# Patient Record
Sex: Male | Born: 1975 | Race: Black or African American | Hispanic: No | Marital: Married | Smoking: Never smoker
Health system: Southern US, Community
[De-identification: ages and names within clinical notes are randomized; demographics above are authoritative.]

## PROBLEM LIST (undated history)

## (undated) HISTORY — DX: Morbid (severe) obesity due to excess calories: E66.01

## (undated) HISTORY — PX: KNEE SURGERY: SHX244

---

## 1998-12-28 ENCOUNTER — Encounter: Payer: Self-pay | Admitting: Emergency Medicine

## 1998-12-28 ENCOUNTER — Emergency Department (HOSPITAL_COMMUNITY): Admission: EM | Admit: 1998-12-28 | Discharge: 1998-12-28 | Payer: Self-pay | Admitting: Emergency Medicine

## 2005-06-26 ENCOUNTER — Encounter: Admission: RE | Admit: 2005-06-26 | Discharge: 2005-06-26 | Payer: Self-pay | Admitting: Internal Medicine

## 2005-10-24 ENCOUNTER — Emergency Department (HOSPITAL_COMMUNITY): Admission: EM | Admit: 2005-10-24 | Discharge: 2005-10-24 | Payer: Self-pay | Admitting: Emergency Medicine

## 2005-11-02 ENCOUNTER — Inpatient Hospital Stay (HOSPITAL_COMMUNITY): Admission: RE | Admit: 2005-11-02 | Discharge: 2005-11-05 | Payer: Self-pay | Admitting: Orthopedic Surgery

## 2007-07-10 ENCOUNTER — Emergency Department (HOSPITAL_COMMUNITY): Admission: EM | Admit: 2007-07-10 | Discharge: 2007-07-10 | Payer: Self-pay | Admitting: Emergency Medicine

## 2008-09-29 ENCOUNTER — Emergency Department (HOSPITAL_COMMUNITY): Admission: EM | Admit: 2008-09-29 | Discharge: 2008-09-29 | Payer: Self-pay | Admitting: Emergency Medicine

## 2010-06-04 ENCOUNTER — Emergency Department (HOSPITAL_BASED_OUTPATIENT_CLINIC_OR_DEPARTMENT_OTHER)
Admission: EM | Admit: 2010-06-04 | Discharge: 2010-06-04 | Payer: Self-pay | Source: Home / Self Care | Admitting: Emergency Medicine

## 2010-07-19 ENCOUNTER — Emergency Department (INDEPENDENT_AMBULATORY_CARE_PROVIDER_SITE_OTHER): Payer: Commercial Managed Care - PPO

## 2010-07-19 ENCOUNTER — Emergency Department (HOSPITAL_BASED_OUTPATIENT_CLINIC_OR_DEPARTMENT_OTHER)
Admission: EM | Admit: 2010-07-19 | Discharge: 2010-07-19 | Disposition: A | Discharge status: Home / Self Care | Payer: Commercial Managed Care - PPO | Attending: Emergency Medicine | Admitting: Emergency Medicine

## 2010-07-19 DIAGNOSIS — R079 Chest pain, unspecified: Secondary | ICD-10-CM | POA: Insufficient documentation

## 2010-07-19 DIAGNOSIS — M79609 Pain in unspecified limb: Secondary | ICD-10-CM | POA: Insufficient documentation

## 2010-07-19 DIAGNOSIS — R071 Chest pain on breathing: Secondary | ICD-10-CM | POA: Insufficient documentation

## 2010-07-19 DIAGNOSIS — I10 Essential (primary) hypertension: Secondary | ICD-10-CM | POA: Insufficient documentation

## 2010-07-19 DIAGNOSIS — J45909 Unspecified asthma, uncomplicated: Secondary | ICD-10-CM | POA: Insufficient documentation

## 2010-08-22 LAB — POCT I-STAT, CHEM 8
BUN: 21 mg/dL (ref 6–23)
Hemoglobin: 15.3 g/dL (ref 13.0–17.0)
Potassium: 3.9 mEq/L (ref 3.5–5.1)
Sodium: 139 mEq/L (ref 135–145)
TCO2: 24 mmol/L (ref 0–100)

## 2010-08-22 LAB — DIFFERENTIAL
Basophils Relative: 1 % (ref 0–1)
Eosinophils Relative: 5 % (ref 0–5)
Monocytes Absolute: 0.5 10*3/uL (ref 0.1–1.0)
Neutro Abs: 3.8 10*3/uL (ref 1.7–7.7)
Neutrophils Relative %: 59 % (ref 43–77)

## 2010-08-22 LAB — CBC
HCT: 42.4 % (ref 39.0–52.0)
Hemoglobin: 14.2 g/dL (ref 13.0–17.0)
MCHC: 33.4 g/dL (ref 30.0–36.0)
MCV: 88 fL (ref 78.0–100.0)
RBC: 4.82 MIL/uL (ref 4.22–5.81)
RDW: 12.9 % (ref 11.5–15.5)

## 2010-08-22 LAB — POCT CARDIAC MARKERS: Troponin i, poc: <0.05 ng/mL (ref 0.00–0.09)

## 2010-09-29 NOTE — Discharge Summary (Signed)
NAMESUMNER, KIRCHMAN                 ACCOUNT NO.:  [id number]   MEDICAL RECORD NO.:  [id number]          PATIENT TYPE:  INP   LOCATION:  1610                         FACILITY:  [facility]   PHYSICIAN:  Georges Lynch. Gioffre, M.D.DATE OF BIRTH:  10/13/1975   DATE OF ADMISSION:  11/02/2005  DATE OF DISCHARGE:  11/05/2005                                 DISCHARGE SUMMARY   ADMISSION DIAGNOSES:  1.  Right knee quad tendon rupture.  2.  Hypertension.  3.  Asthma.   DISCHARGE DIAGNOSES:  1.  Surgical repair of right quad tendon rupture with sutures and graft      material.  2.  Hypertension.  3.  Asthma.   HISTORY OF PRESENT ILLNESS:  The patient is a 35 year old gentleman who was  playing basketball when he jumped up to try to dunk the ball when he felt  like he had someone kick him in the knee. He fell to the ground buckling his  leg up underneath him. He was unable to fully extend and ambulate on that  leg due to discomfort. He was taken to the ER where x-rays were taken  without any signs of dislocations or fractures. He was placed in a long-leg  splint, returned to our office. Further evaluation was suggestive of a quad  tendon ruptured. An MRI was performed and this confirmed that he did in fact  have a quad tendon rupture on the right knee and he was set up for surgical  repair.   PROCEDURE:  On June 22, the patient was taken to the OR by Dr. Worthy Rancher,  assisted by Arlyn Leak, PA-C. Under general anesthesia, he underwent a quad  tendon repair of his right knee with two Restore patches. The patient  tolerated the procedure well and was transferred to the recovery room and  then to the orthopedic floor in good condition.   CONSULTATIONS:  Physical therapy consult.   HOSPITAL COURSE:  On June 22, the patient was admitted to [facility] under the care of Dr. Worthy Rancher. The patient was taken to the OR  where a right knee quad tendon repair was performed requiring two  Restore  patches. The patient tolerated the procedure well and was transferred to the  recovery room in good condition and then to the orthopedic floor. The  patient was on IV antibiotics, pain medicines and started on Coumadin  protocol for DVT prophylaxis. The patient then incurred a 3-day  postoperative course on the orthopedic floor while his Coumadin was becoming  therapeutic. He worked well with physical therapy, his pain was well-  controlled. He was able to transfer from IV medications to p.o. medications  well without any complications. His wound remained benign for any signs of  infection. His leg remained neuromotor vascularly intact. On postoperative  day #3, it was felt the patient was ready for discharge home. He was  discharged on Coumadin and p.o. pain medications with a recommendation to  followup in one week from date of discharge. The patient was discharged in  good condition.   LABORATORY  DATA:  CBC on June 23, WBC is 9.3, hemoglobin 12.2, hematocrit  36.2, platelets 115. INR on the 25th of June was 1.2, routine chemistries on  admission were normal. EKG on admission was normal sinus rhythm at 83 beats  per minute. Chest x-ray on admission showed no cardiopulmonary disease.   MEDICATIONS ON DISCHARGE FROM ORTHOPEDIC FLOOR:  1.  Robaxin 500 mg p.o. q.6 h p.r.n.  2.  Reglan 10 mg p.o. q.6 h p.r.n.  3.  Benadryl 25 mg p.o. q.6 h p.r.n.  4.  Percocet 1 or 2 tablets every 4-6 h p.r.n. pain.  5.  Laxative or enema of choice p.r.n.  6.  Heparin 5000 units subcu q.12 h.  7.  Coumadin 10 mg p.o. daily.   DISCHARGE INSTRUCTIONS:  1.  Diet no restrictions.  2.  Activity:  Walking with assistance and the use of a walker and leg      immobilizer.  3.  Wound care:  The patient is to change dressing daily.  4.  The patient is to resume routine home medications which included Advair,      albuterol, Singulair, Triamterene/hydrochlorothiazide, doxycycline and      Aleve.  5.   Prescriptions as written were Percocet and Robaxin.  6.  Followup:  The patient needs a followup appointment with Dr. Darrelyn Hillock in      his office in 1 week. The patient is to call [phone] for a followup      appointment.   CONDITION ON DISCHARGE:  Improved and good.      Jamelle Rushing, P.A.    ______________________________  Georges Lynch Darrelyn Hillock, M.D.    RWK/MEDQ  D:  11/21/2005  T:  11/21/2005  Job:  [id number]

## 2010-09-29 NOTE — H&P (Signed)
NAMEMASOUD, Travis Lynch                 ACCOUNT NO.:  [id number]   MEDICAL RECORD NO.:  [id number]          PATIENT TYPE:  AMB   LOCATION:  DAY                          FACILITY:  [facility]   PHYSICIAN:  Travis Lynch. Gioffre, M.D.DATE OF BIRTH:  February 20, 1976   DATE OF ADMISSION:  11/02/2005  DATE OF DISCHARGE:                                HISTORY & PHYSICAL   CHIEF COMPLAINT:  Painful and inability to extend right knee.   HISTORY OF PRESENT ILLNESS:  The patient is a 35 year old gentleman who Dr.  Darrelyn Lynch and myself have seen in the office previously, about a week  previous, for right knee pain.  On evaluation there was a question of  whether he had a ruptured quad tendon.  MRI confirmed this, full thickness  tear with just a few remaining fibers.  The patient incurred this injury  while playing basketball and he felt like someone hit him in the knee.  When  he landed his knee completely buckled, and he was unable to extend his knee  at that time.   ALLERGIES:  No known drug allergies.   CURRENT MEDICATIONS:  1.  Advair twice a day.  2.  Albuterol inhaler p.r.n.  3.  Singulair 10 mg a day.  4.  Triamterene/hydrochlorothiazide at 37.5/25 mg a day.  5.  Doxycycline 100 mg a day.  6.  Aleve p.r.n.   PRESENT MEDICAL HISTORY:  1.  Seasonal allergies.  2.  Asthma.   PAST SURGICAL HISTORY:  Small cyst removal from a finger.   SOCIAL HISTORY:  The patient is married, lives with his wife, works as a  Paediatric nurse.  Otherwise review of systems complaints are negative for cardiac,  respiratory, other than last being hospitalized in 1995 for his asthma  attack and it is well controlled with his current medications.  No GI, no  GU, no neurologic, no other general complaints.   PHYSICAL EXAMINATION:  GENERAL:  The patient is a healthy-appearing, well  developed, 5 foot 11 inches, black male, conscious, alert, appropriate.  HEENT:  Head was normocephalic.  Pupils equal, round, and reactive.  Extraocular movements intact.  Oral buccal mucosa is pink and moist.  NECK:  Supple.  No palpable lymphadenopathy.  Good range of motion.  CHEST:  Lung sounds were clear and equal bilaterally.  No wheezes, rales,  rhonchi.  HEART: Regular rate and rhythm.  No murmurs, rubs, or gallops.  ABDOMEN:  Soft nontender.  EXTREMITIES:  Upper extremities were symmetrically sized and shaped.  He had  full range of motion of shoulder, elbows, and wrists.  Lower extremities:  Right knee was swollen, very tender.  He did have a palpable defect in the  quad tendon just above the superior pole of the patella.  He was unable to  fully extend.  He was neurologically intact.  The left knee was normal range  of motion and normal-appearing.  Peripheral vascular:  Distal legs had good  pulses.  No sign of any edema or pigmentation.  BREAST/RECTAL/GU:  Deferred.   IMPRESSION:  1.  Right knee quadriceps tendon rupture.  2.  Asthma.  3.  Multiple seasonal allergies.   PLAN:  The patient had a long discussion with Dr. Darrelyn Lynch about surgical  repair of his quad tendon.  At this time, Dr. Darrelyn Lynch feels it is necessary  as it will not heal itself.  He also reviewed the possible complications  related to the surgical procedure and the patient has elected to proceed and  is here today in preoperative lab area for this surgical procedure.   The patient's H&H was within normal limits as was his C-MET.   We will proceed without further testing.      Jamelle Rushing, P.A.    ______________________________  Travis Lynch, M.D.    RWK/MEDQ  D:  11/02/2005  T:  11/02/2005  Job:  [id number]

## 2010-09-29 NOTE — Op Note (Signed)
NAMEBEVIN, MAYALL                 ACCOUNT NO.:  [id number]   MEDICAL RECORD NO.:  [id number]          PATIENT TYPE:  INP   LOCATION:  1610                         FACILITY:  [facility]   PHYSICIAN:  Georges Lynch. Gioffre, M.D.DATE OF BIRTH:  09-16-1975   DATE OF PROCEDURE:  11/02/2005  DATE OF DISCHARGE:                                 OPERATIVE REPORT   SURGEON:  Georges Lynch. Darrelyn Hillock, M.D.   ASSISTANT:  Jamelle Rushing, P.A.   PREOP DIAGNOSIS:  Complete rupture of the right quadriceps tendon.   POSTOP DIAGNOSIS:  Complete rupture of the right quadriceps tendon.   OPERATIONS:  1.  A primary repair of the quadriceps tendon, right knee utilizing #5      Ethibond sutures through drill holes in the patella.  2.  We utilized two Restore tendon graft.   PROCEDURE:  Under general anesthesia the patient was brought to the OR, and  complete prepping and draping of the right lower extremity carried out.  At  this time, the patient had 1 gram of IV Ancef.  The leg was exsanguinated  with Esmarch.  Tourniquet was elevated at at 400 mmHg.  Total tourniquet  time was about 43 minutes.   At this time an incision was made over the anterior aspect of the right  knee, bleeders were identified and cauterized.  Incision was carried down to  the quadriceps tendon.  We noted that the undersurface of the tendon still  was partially attached to the patella.  Otherwise, the tendon basically was  completely ruptured.  We thoroughly irrigated out the area, removed the  hematoma, and then 2 drill holes were made in the patella.  Following that  the #5 Ethibond suture was brought up through the drill holes of the patella  and up through the tendon and primary repair was carried out.  We utilized 2  Ethibond sutures.  We then did our medial and lateral repairs of the  quadriceps tendon as well with #1 Ethibond suture.  Following that, we then  utilized 2 Restore tendon grafts to reinforce the quadriceps tendon.  The  wound then was irrigated out with antibiotic solution.  Subcu was closed  with #0 Vicryl; skin was closed with metal staples.  A sterile Neosporin  dressing was applied.  The patient left the operating room in satisfactory  position.  She had 1 gram of IV Ancef preop.   POSTOPERATIVE ORDERS:  1.  We are going to keep the knee immobilized.  2.  He is going to be on Coumadin/heparin protocol and Ancef.           ______________________________  Georges Lynch. Darrelyn Hillock, M.D.     RAG/MEDQ  D:  11/02/2005  T:  11/02/2005  Job:  [id number]

## 2011-02-27 ENCOUNTER — Other Ambulatory Visit: Payer: Self-pay

## 2011-02-27 ENCOUNTER — Emergency Department (HOSPITAL_BASED_OUTPATIENT_CLINIC_OR_DEPARTMENT_OTHER)
Admission: EM | Admit: 2011-02-27 | Discharge: 2011-02-27 | Disposition: A | Discharge status: Home / Self Care | Payer: Commercial Managed Care - PPO | Attending: Emergency Medicine | Admitting: Emergency Medicine

## 2011-02-27 ENCOUNTER — Emergency Department (INDEPENDENT_AMBULATORY_CARE_PROVIDER_SITE_OTHER): Payer: Commercial Managed Care - PPO

## 2011-02-27 DIAGNOSIS — D696 Thrombocytopenia, unspecified: Secondary | ICD-10-CM | POA: Insufficient documentation

## 2011-02-27 DIAGNOSIS — I491 Atrial premature depolarization: Secondary | ICD-10-CM | POA: Insufficient documentation

## 2011-02-27 DIAGNOSIS — J45909 Unspecified asthma, uncomplicated: Secondary | ICD-10-CM | POA: Insufficient documentation

## 2011-02-27 DIAGNOSIS — D72820 Lymphocytosis (symptomatic): Secondary | ICD-10-CM | POA: Insufficient documentation

## 2011-02-27 DIAGNOSIS — R002 Palpitations: Secondary | ICD-10-CM | POA: Insufficient documentation

## 2011-02-27 LAB — DIFFERENTIAL
Basophils Relative: 1 % (ref 0–1)
Eosinophils Absolute: 0.3 10*3/uL (ref 0.0–0.7)
Lymphs Abs: 1.8 10*3/uL (ref 0.7–4.0)
Monocytes Absolute: 0.4 10*3/uL (ref 0.1–1.0)
Neutro Abs: 1.9 10*3/uL (ref 1.7–7.7)

## 2011-02-27 LAB — COMPREHENSIVE METABOLIC PANEL
ALT: 61 U/L — ABNORMAL HIGH (ref 0–53)
Albumin: 3.8 g/dL (ref 3.5–5.2)
Alkaline Phosphatase: 72 U/L (ref 39–117)
Chloride: 105 mEq/L (ref 96–112)
Glucose, Bld: 97 mg/dL (ref 70–99)
Potassium: 3.8 mEq/L (ref 3.5–5.1)
Sodium: 139 mEq/L (ref 135–145)
Total Protein: 7.5 g/dL (ref 6.0–8.3)

## 2011-02-27 LAB — CBC
HCT: 40.9 % (ref 39.0–52.0)
Hemoglobin: 13.8 g/dL (ref 13.0–17.0)
MCH: 28.5 pg (ref 26.0–34.0)
MCHC: 33.7 g/dL (ref 30.0–36.0)

## 2011-02-27 MED ORDER — ASPIRIN 81 MG PO CHEW
324.0000 mg | CHEWABLE_TABLET | Freq: Once | ORAL | Status: AC
  Administered 2011-02-27: 324 mg via ORAL
  Filled 2011-02-27: qty 4

## 2011-02-27 NOTE — ED Notes (Signed)
Pt reports onset of heart palpitations this am.  Denies chest pain/SHOB.  Episodes lasting several minutes.

## 2011-02-27 NOTE — ED Notes (Signed)
MD at bedside. 

## 2011-02-27 NOTE — ED Provider Notes (Signed)
History     CSN: [id number] Arrival date & time: 02/27/2011  8:58 AM Seen in room 1 [facility] at 9:24 AM   Chief Complaint  Patient presents with  . Palpitations    (Consider location/radiation/quality/duration/timing/severity/associated sxs/prior treatment) HPI 35 y.o. Male states palpitations began this a.m. Fluttering- noted at 0730 after getting up and was walking around.  Symptoms still occurring but less severe now.  States was happening more  Denies chest pain or sob.  Patient states chest a little tight because he hasn't taken advair since Sunday then lost it.  He has called in for refill.  Patient has had some symptoms in past and asked his doctor about fluttering but they were infrequent.  Patient followed by Dr. Lendon Colonel.    Past Medical History  Diagnosis Date  . Asthma    Reviewed  Past Surgical History  Procedure Date  . Knee surgery    Reviewed.   No family history on file.  History  Substance Use Topics  . Smoking status: Never Smoker   . Smokeless tobacco: Never Used  . Alcohol Use: No      Review of Systems  All other systems reviewed and are negative.    Allergies  Review of patient's allergies indicates no known allergies.  Home Medications   Current Outpatient Rx  Name Route Sig Dispense Refill  . IPRATROPIUM-ALBUTEROL 18-103 MCG/ACT IN AERO Inhalation Inhale 2 puffs into the lungs every 6 (six) hours as needed. As needed for asthma    . ASPIRIN 325 MG PO TABS Oral Take 325 mg by mouth daily. As needed for headache     . FLUTICASONE-SALMETEROL 500-50 MCG/DOSE IN AEPB Inhalation Inhale 1 puff into the lungs every 12 (twelve) hours.       BP 159/94  Pulse 92  Temp 98.3 F (36.8 C)  Resp 18  Ht 5\' 11"  (1.803 m)  Wt 295 lb (133.811 kg)  BMI 41.14 kg/m2  SpO2 100%  Physical Exam  Nursing note and vitals reviewed. Constitutional: He is oriented to person, place, and time. He appears well-developed and well-nourished.  HENT:  Head:  Normocephalic and atraumatic.  Eyes: Conjunctivae and EOM are normal. Pupils are equal, round, and reactive to light.  Neck: Normal range of motion. Neck supple.  Cardiovascular: Normal rate.   Pulmonary/Chest: Effort normal and breath sounds normal.  Abdominal: Soft. Bowel sounds are normal.  Musculoskeletal: Normal range of motion.  Neurological: He is alert and oriented to person, place, and time. He has normal reflexes.  Skin: Skin is warm and dry.  Psychiatric: He has a normal mood and affect.    ED Course  Procedures (including critical care time)  No results found.  Dg Chest Portable 1 View  02/27/2011  *RADIOLOGY REPORT*  Clinical Data: Palpitations.  CHEST - 1 VIEW  Comparison:  07/19/2010  Findings: The heart size and mediastinal contours are within normal limits.  Both lungs are clear.  IMPRESSION: No active disease.  Original Report Authenticated By: Reola Calkins, M.D.   No diagnosis found. Results for orders placed during the hospital encounter of 02/27/11  COMPREHENSIVE METABOLIC PANEL      Component Value Range   Sodium 139  135 - 145 (mEq/L)   Potassium 3.8  3.5 - 5.1 (mEq/L)   Chloride 105  96 - 112 (mEq/L)   CO2 25  19 - 32 (mEq/L)   Glucose, Bld 97  70 - 99 (mg/dL)   BUN 14  6 - 23 (  mg/dL)   Creatinine, Ser 4.09  0.50 - 1.35 (mg/dL)   Calcium 9.6  8.4 - 81.1 (mg/dL)   Total Protein 7.5  6.0 - 8.3 (g/dL)   Albumin 3.8  3.5 - 5.2 (g/dL)   AST 27  0 - 37 (U/L)   ALT 61 (*) 0 - 53 (U/L)   Alkaline Phosphatase 72  39 - 117 (U/L)   Total Bilirubin 0.3  0.3 - 1.2 (mg/dL)   GFR calc non Af Amer >90  >90 (mL/min)   GFR calc Af Amer >90  >90 (mL/min)  TROPONIN I      Component Value Range   Troponin I <0.30  <0.30 (ng/mL)  CBC      Component Value Range   WBC 4.4  4.0 - 10.5 (K/uL)   RBC 4.85  4.22 - 5.81 (MIL/uL)   Hemoglobin 13.8  13.0 - 17.0 (g/dL)   HCT 91.4  78.2 - 95.6 (%)   MCV 84.3  78.0 - 100.0 (fL)   MCH 28.5  26.0 - 34.0 (pg)   MCHC 33.7   30.0 - 36.0 (g/dL)   RDW 21.3  08.6 - 57.8 (%)   Platelets 92 (*) 150 - 400 (K/uL)  DIFFERENTIAL      Component Value Range   Neutrophils Relative 43  43 - 77 (%)   Lymphocytes Relative 40  12 - 46 (%)   Monocytes Relative 10  3 - 12 (%)   Eosinophils Relative 6 (*) 0 - 5 (%)   Basophils Relative 1  0 - 1 (%)   Neutro Abs 1.9  1.7 - 7.7 (K/uL)   Lymphs Abs 1.8  0.7 - 4.0 (K/uL)   Monocytes Absolute 0.4  0.1 - 1.0 (K/uL)   Eosinophils Absolute 0.3  0.0 - 0.7 (K/uL)   Basophils Absolute 0.0  0.0 - 0.1 (K/uL)   WBC Morphology ATYPICAL LYMPHOCYTES     Smear Review PLATELET COUNT CONFIRMED BY SMEAR       MDM   Date: 02/27/2011  Rate: 64  Rhythm: normal sinus rhythm and premature atrial contractions (PAC)  QRS Axis: normal  Intervals: normal  ST/T Wave abnormalities: normal  Conduction Disutrbances:none and first-degree A-V block   Narrative Interpretation:   Old EKG Reviewed: changes noted, pac new    1- palpitations 2- thrombocytopenia- patient advised follow up and recheck to work up if continued or worsening- ddx pseudothrombocytopenia, drug induced, viral infection, itp, 3-atypical lymphocytes - advised recheck.     Hilario Quarry, MD 02/27/11 [phone]

## 2011-12-04 ENCOUNTER — Encounter (HOSPITAL_COMMUNITY): Payer: Self-pay | Admitting: *Deleted

## 2011-12-04 ENCOUNTER — Emergency Department (HOSPITAL_COMMUNITY)
Admission: EM | Admit: 2011-12-04 | Discharge: 2011-12-04 | Disposition: A | Discharge status: Home / Self Care | Payer: Commercial Managed Care - PPO | Attending: Emergency Medicine | Admitting: Emergency Medicine

## 2011-12-04 DIAGNOSIS — R002 Palpitations: Secondary | ICD-10-CM

## 2011-12-04 DIAGNOSIS — R42 Dizziness and giddiness: Secondary | ICD-10-CM | POA: Insufficient documentation

## 2011-12-04 LAB — CBC WITH DIFFERENTIAL/PLATELET
Basophils Absolute: 0 10*3/uL (ref 0.0–0.1)
Eosinophils Relative: 2 % (ref 0–5)
HCT: 40.9 % (ref 39.0–52.0)
Lymphocytes Relative: 31 % (ref 12–46)
MCHC: 34.2 g/dL (ref 30.0–36.0)
MCV: 84.7 fL (ref 78.0–100.0)
Monocytes Absolute: 0.6 10*3/uL (ref 0.1–1.0)
RDW: 12.9 % (ref 11.5–15.5)

## 2011-12-04 LAB — POCT I-STAT, CHEM 8
Chloride: 105 mEq/L (ref 96–112)
Creatinine, Ser: 1.1 mg/dL (ref 0.50–1.35)
Glucose, Bld: 87 mg/dL (ref 70–99)
HCT: 43 % (ref 39.0–52.0)
Potassium: 4.5 mEq/L (ref 3.5–5.1)

## 2011-12-04 LAB — MAGNESIUM: Magnesium: 1.9 mg/dL (ref 1.5–2.5)

## 2011-12-04 LAB — COMPREHENSIVE METABOLIC PANEL
Albumin: 3.8 g/dL (ref 3.5–5.2)
BUN: 17 mg/dL (ref 6–23)
Chloride: 102 mEq/L (ref 96–112)
Creatinine, Ser: 0.94 mg/dL (ref 0.50–1.35)
GFR calc non Af Amer: >90 mL/min (ref >90)
Total Bilirubin: 0.3 mg/dL (ref 0.3–1.2)

## 2011-12-04 NOTE — ED Notes (Signed)
Patient was at home last night looking at his ipad. He started getting sweaty, had a fast heart rate, lips turned white. After sitting down, symptoms went away after a few minutes. The same thing happened today at work. He was sitting down, doing nothing. Denies SOB, numbness, tingling, N/V/D, CP. At this time, the patient doesn't have any of the current symptoms.

## 2011-12-04 NOTE — ED Provider Notes (Signed)
Medical screening examination/treatment/procedure(s) were performed by non-physician practitioner and as supervising physician I was immediately available for consultation/collaboration. Devoria Albe, MD, Armando Gang   Ward Givens, MD 12/04/11 2040

## 2011-12-04 NOTE — ED Notes (Signed)
Escorted patient and family to discharge window. No stress noted upon discharge.

## 2011-12-04 NOTE — ED Notes (Signed)
Patient stated that last night he was looking at Ipad and got real sweaty, had a fast heart rate, lips turned white, lightheaded. Went away about "a couple of  Minutes" after symptoms started. Same thing happened today while at work.

## 2011-12-04 NOTE — ED Provider Notes (Signed)
History     CSN: [id number]  Arrival date & time 12/04/11  1123   First MD Initiated Contact with Patient 12/04/11 1139      Chief Complaint  Patient presents with  . Tachycardia  . Dizziness    (Consider location/radiation/quality/duration/timing/severity/associated sxs/prior treatment) HPI Comments: Pt was in his normal state of good health yesterday when he suddenly began to feel lightheaded, diaphoretic, with palpitations (heart racing).  States he walked up the stairs to discuss this with his wife and she had him sit down - the symptoms resolved after a few minutes.  States he had another episode of this again today while at work, also resolved after a few minutes.  Notes that his lips turned pale with this episode.  Denies CP, SOB, cough.  No fevers or recent illness.  No dizziness, gait abnormalities, no focal neurological deficits.    The history is provided by the patient.    Past Medical History  Diagnosis Date  . Asthma     Past Surgical History  Procedure Date  . Knee surgery     History reviewed. No pertinent family history.  History  Substance Use Topics  . Smoking status: Never Smoker   . Smokeless tobacco: Never Used  . Alcohol Use: No      Review of Systems  Constitutional: Negative for fever and chills.  Respiratory: Negative for cough and shortness of breath.   Cardiovascular: Positive for palpitations. Negative for chest pain.  Gastrointestinal: Negative for nausea, vomiting, abdominal pain and diarrhea.  Genitourinary: Negative for dysuria, urgency and frequency.  Musculoskeletal: Negative for back pain.    Allergies  Apple and Pear  Home Medications   Current Outpatient Rx  Name Route Sig Dispense Refill  . IPRATROPIUM-ALBUTEROL 18-103 MCG/ACT IN AERO Inhalation Inhale 2 puffs into the lungs every 6 (six) hours as needed. As needed for asthma    . ASPIRIN 325 MG PO TABS Oral Take 325 mg by mouth daily. As needed for headache     .  CETIRIZINE HCL 10 MG PO TABS Oral Take 10 mg by mouth daily. As needed for allergies     . FLUTICASONE-SALMETEROL 500-50 MCG/DOSE IN AEPB Inhalation Inhale 1 puff into the lungs every 12 (twelve) hours.       BP 137/86  Pulse 92  Temp 98.7 F (37.1 C) (Oral)  Resp 18  SpO2 97%  Physical Exam  Nursing note and vitals reviewed. Constitutional: He appears well-developed and well-nourished. No distress.  HENT:  Head: Normocephalic and atraumatic.  Neck: Neck supple.  Cardiovascular: Normal rate, regular rhythm, normal heart sounds and intact distal pulses.   Pulmonary/Chest: Effort normal and breath sounds normal. No respiratory distress. He has no wheezes. He has no rales.  Abdominal: Soft. He exhibits no distension. There is no tenderness. There is no rebound and no guarding.  Neurological: He is alert.  Skin: He is not diaphoretic.    ED Course  Procedures (including critical care time)  Labs Reviewed  CBC WITH DIFFERENTIAL - Abnormal; Notable for the following:    Platelets 120 (*)     All other components within normal limits  POCT I-STAT, CHEM 8 - Abnormal; Notable for the following:    Calcium, Ion 1.30 (*)     All other components within normal limits  TROPONIN I  COMPREHENSIVE METABOLIC PANEL  MAGNESIUM   No results found.  1:06 PM Discussed patient with Dr Lars Mage.     Date: 12/04/2011  Rate:  76  Rhythm: normal sinus rhythm  QRS Axis: normal  Intervals: normal  ST/T Wave abnormalities: normal  Conduction Disutrbances:none  Narrative Interpretation:   Old EKG Reviewed: unchanged  Filed Vitals:   12/04/11 1534  BP: 114/71  Pulse: 84  Temp: 98.1 F (36.7 C)  Resp:      2:39 PM Discussed all results with patient and family.  Pt has had no more episodes of palpitations or lightheadedness.  Plan is for d/c home with cardiology follow up.   1. Palpitations   2. Lightheadedness       MDM  Pt with 2 episodes of palpitations, lightheadedness.   Symptoms did not reoccur in ED.  EKG normal, no abnormalities on monitor.  Labs unremarkable.  Pt d/c home with cardiology follow up.  Discussed all results with patient.  Pt given return precautions.  Pt verbalizes understanding and agrees with plan.          Melissa, [place] 12/04/11 1934

## 2012-11-15 IMAGING — CR DG CHEST 2V
2 series · 2 of 2 positions shown · non-contrast
Comparison: 06/04/2010

CLINICAL DATA: Chest pain, left axillary pain.

CHEST - 2 VIEW

[w chest lat]
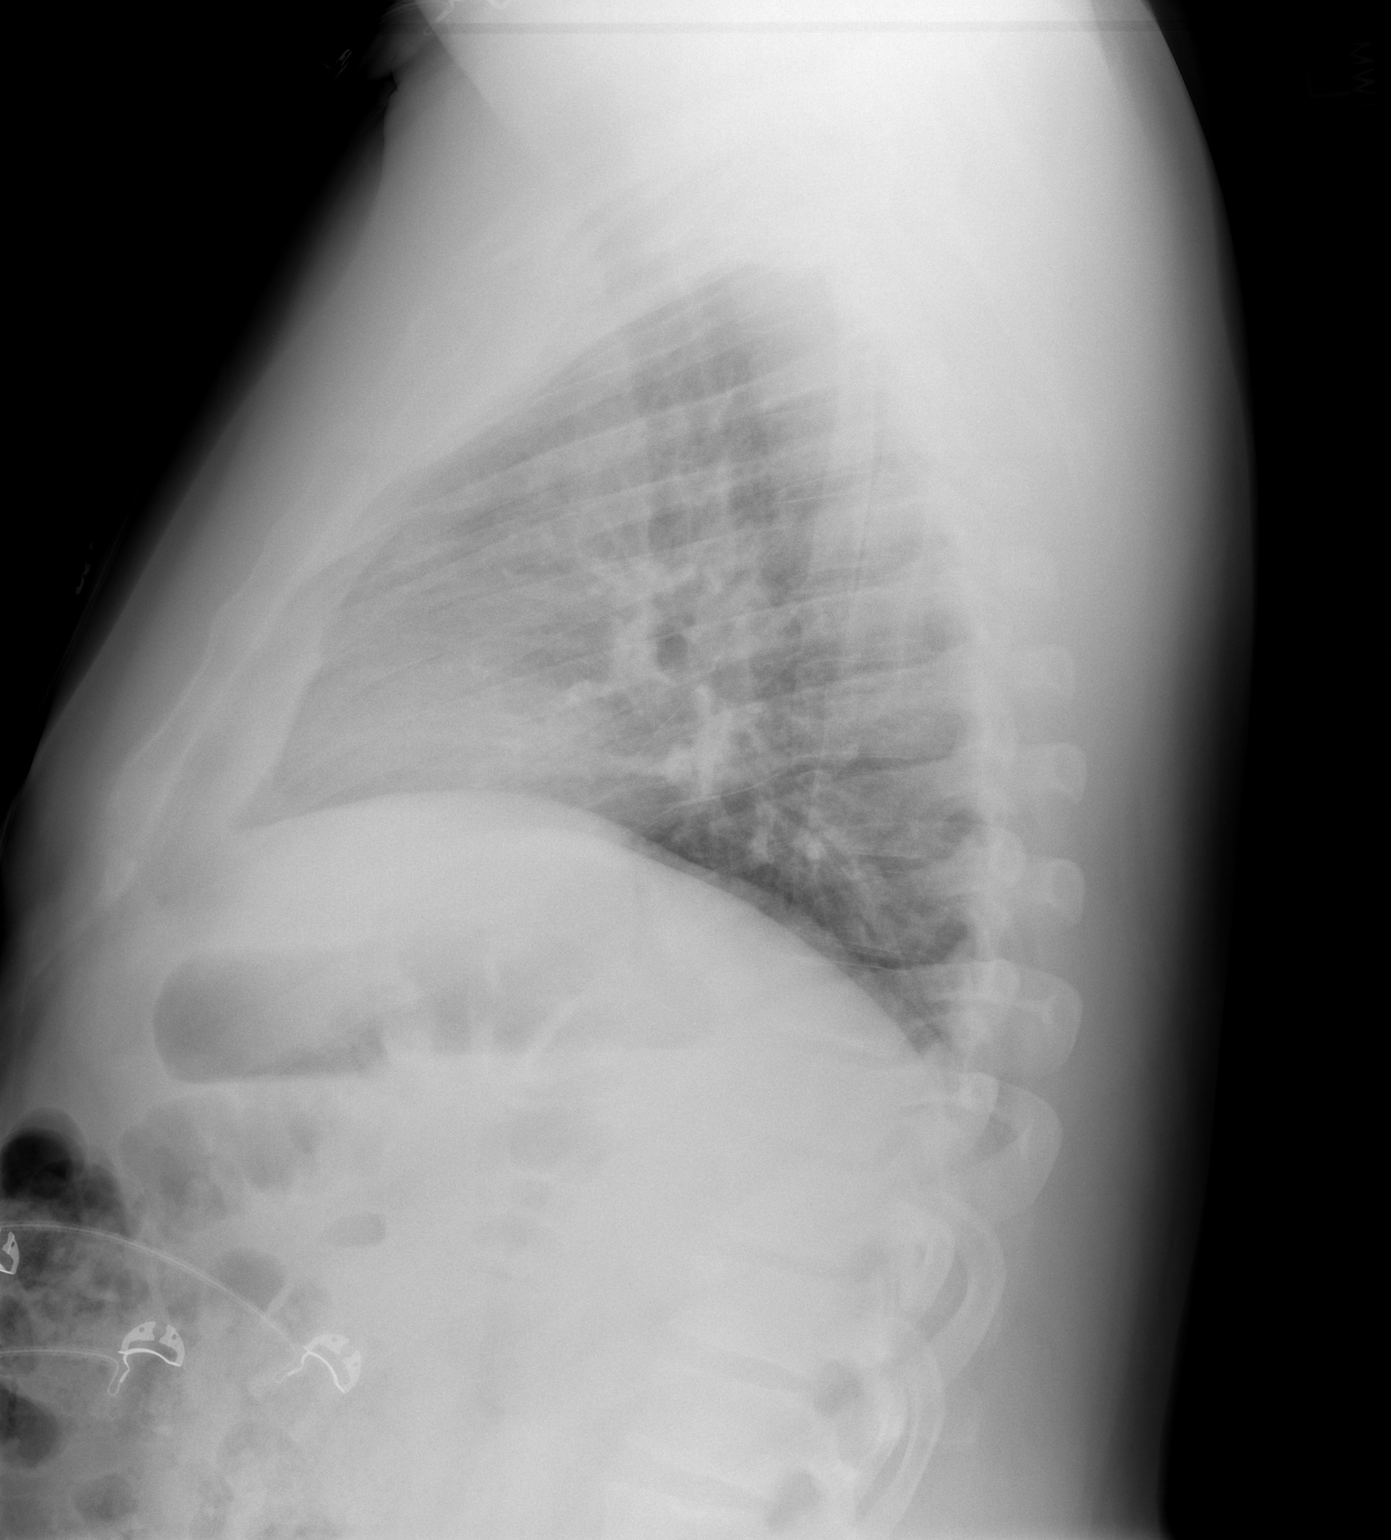

[w chest pa]
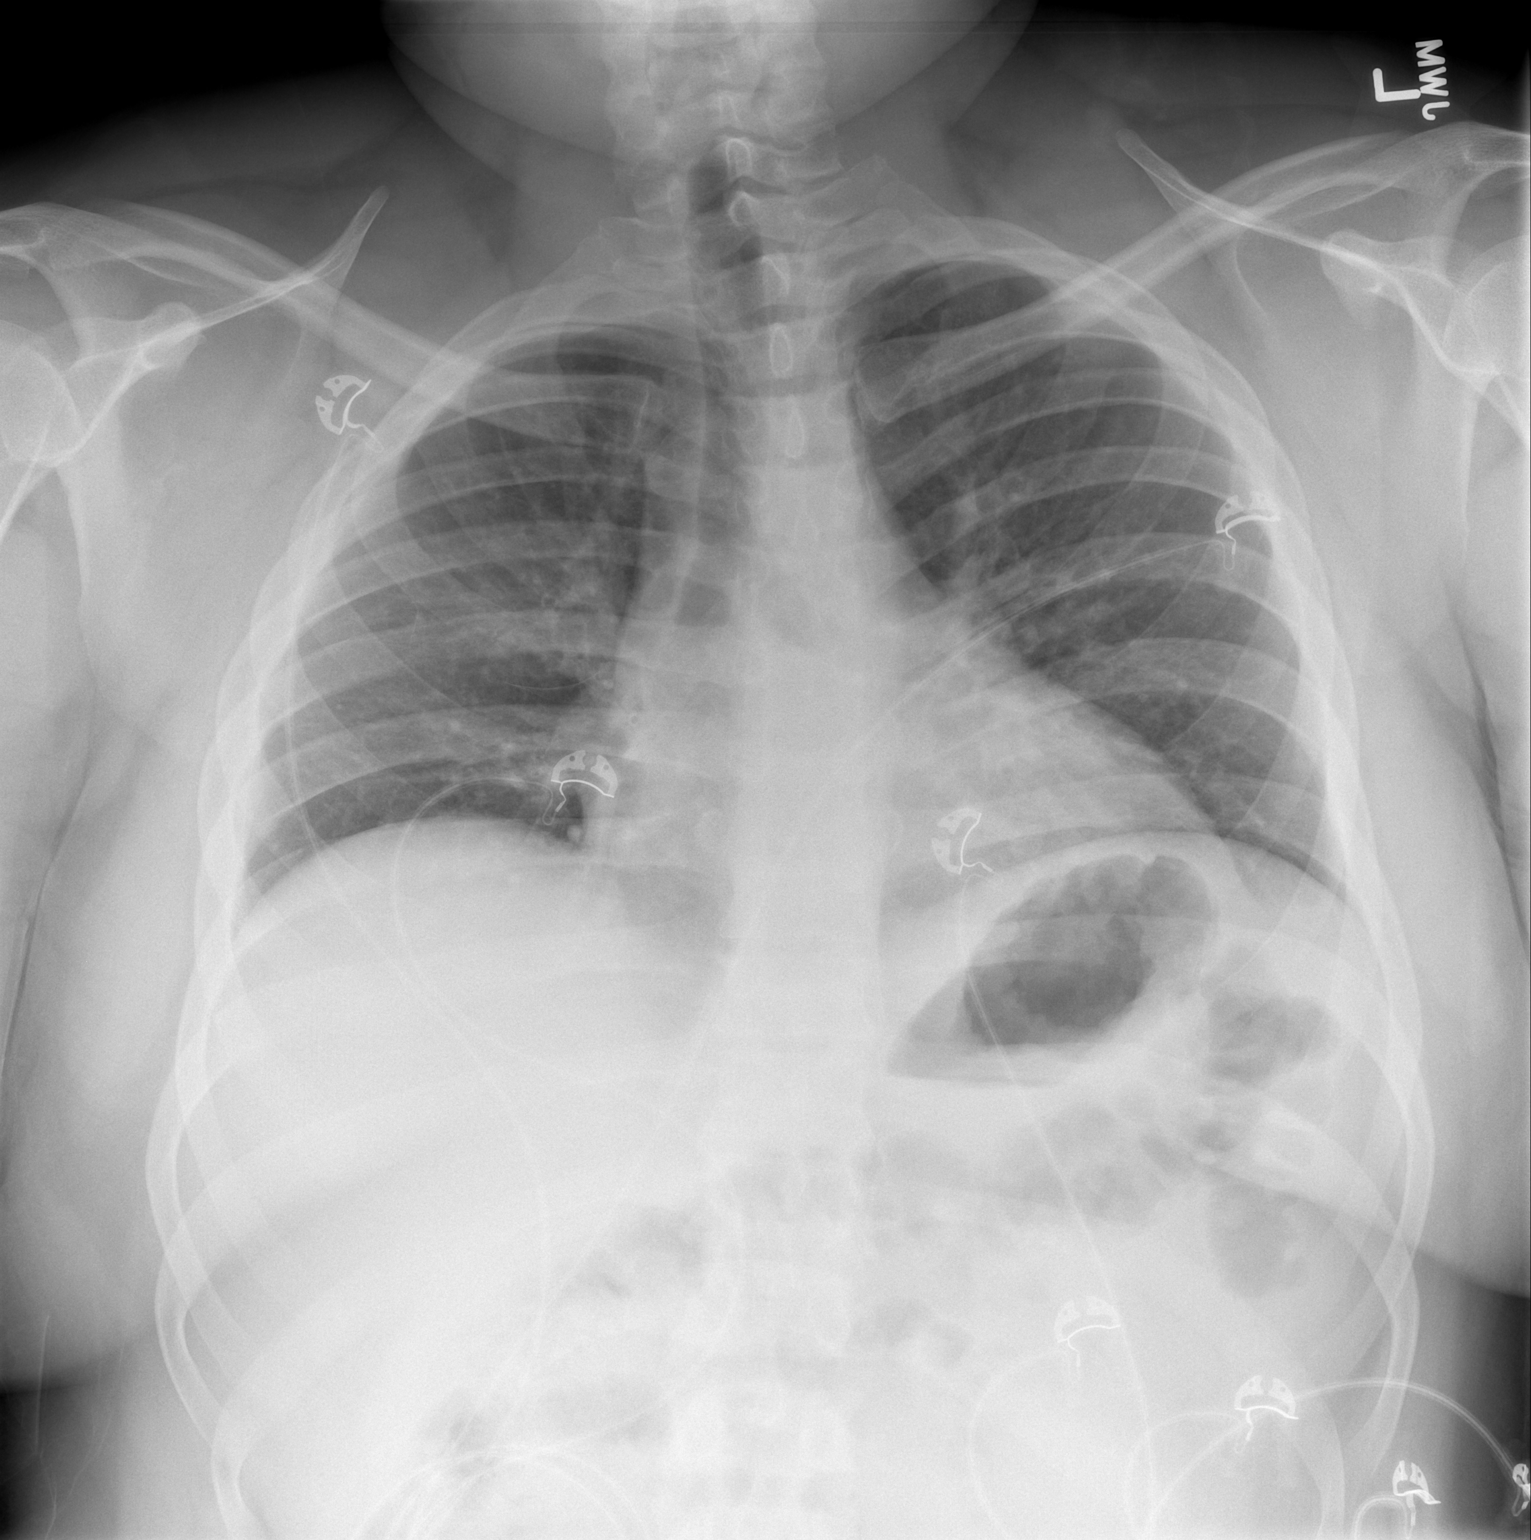

[2 of 2 positions shown; findings below may reference images not displayed]

FINDINGS: There are low lung volumes.  No confluent opacities or
effusions.  Heart size is accentuated by the low volumes, likely
within normal limits.  No acute bony abnormality.
IMPRESSION: Low lung volumes.  No acute findings.

## 2015-06-06 MED FILL — VENTOLIN HFA 90 MCG INHALER: 108 (90 BAS | 30 days supply | Qty: 18 | Fill #2

## 2015-06-20 MED FILL — ADVAIR 250/50 DISKUS: 250-50 | 90 days supply | Qty: 180 | Fill #1

## 2015-07-06 DIAGNOSIS — J0140 Acute pansinusitis, unspecified: Secondary | ICD-10-CM | POA: Diagnosis not present

## 2015-07-06 MED FILL — FLUTICASONE PROP 50 MCG SPR: 50 | 30 days supply | Qty: 16 | Fill #0

## 2015-07-06 MED FILL — AMOX-CLAV 875-125 MG TABLET: 875-125 | 7 days supply | Qty: 14 | Fill #0

## 2015-07-07 MED FILL — HYDROCHLOROTHIAZIDE 25 MG T: 25 | 90 days supply | Qty: 45 | Fill #0

## 2015-08-30 DIAGNOSIS — I10 Essential (primary) hypertension: Secondary | ICD-10-CM | POA: Diagnosis not present

## 2015-08-30 DIAGNOSIS — I8312 Varicose veins of left lower extremity with inflammation: Secondary | ICD-10-CM | POA: Diagnosis not present

## 2015-08-30 DIAGNOSIS — I8393 Asymptomatic varicose veins of bilateral lower extremities: Secondary | ICD-10-CM | POA: Diagnosis not present

## 2015-08-30 DIAGNOSIS — J301 Allergic rhinitis due to pollen: Secondary | ICD-10-CM | POA: Diagnosis not present

## 2015-08-30 DIAGNOSIS — G4733 Obstructive sleep apnea (adult) (pediatric): Secondary | ICD-10-CM | POA: Diagnosis not present

## 2015-10-12 MED FILL — VENTOLIN HFA 90 MCG INHALER: 108 (90 BAS | 30 days supply | Qty: 18 | Fill #0

## 2015-10-17 DIAGNOSIS — J45901 Unspecified asthma with (acute) exacerbation: Secondary | ICD-10-CM | POA: Diagnosis not present

## 2015-10-17 DIAGNOSIS — K219 Gastro-esophageal reflux disease without esophagitis: Secondary | ICD-10-CM | POA: Diagnosis not present

## 2015-10-17 MED FILL — predniSONE 20 MG TABS: 20 | 5 days supply | Qty: 10 | Fill #0

## 2015-11-29 DIAGNOSIS — R7301 Impaired fasting glucose: Secondary | ICD-10-CM | POA: Diagnosis not present

## 2015-11-29 DIAGNOSIS — B353 Tinea pedis: Secondary | ICD-10-CM | POA: Diagnosis not present

## 2015-12-12 MED FILL — ADVAIR 250/50 DISKUS: 250-50 | 60 days supply | Qty: 120 | Fill #0

## 2016-01-17 MED FILL — VENTOLIN HFA 90 MCG INHALER: 108 (90 BAS | 30 days supply | Qty: 18 | Fill #1

## 2020-04-10 ENCOUNTER — Emergency Department (HOSPITAL_COMMUNITY): Payer: PRIVATE HEALTH INSURANCE

## 2020-04-10 ENCOUNTER — Other Ambulatory Visit: Payer: Self-pay

## 2020-04-10 ENCOUNTER — Emergency Department (HOSPITAL_COMMUNITY)
Admission: EM | Admit: 2020-04-10 | Discharge: 2020-04-10 | Disposition: A | Discharge status: Home / Self Care | Payer: PRIVATE HEALTH INSURANCE | Attending: Emergency Medicine | Admitting: Emergency Medicine

## 2020-04-10 ENCOUNTER — Encounter (HOSPITAL_COMMUNITY): Payer: Self-pay | Admitting: Emergency Medicine

## 2020-04-10 DIAGNOSIS — Z7951 Long term (current) use of inhaled steroids: Secondary | ICD-10-CM | POA: Diagnosis not present

## 2020-04-10 DIAGNOSIS — R059 Cough, unspecified: Secondary | ICD-10-CM | POA: Diagnosis present

## 2020-04-10 DIAGNOSIS — J45909 Unspecified asthma, uncomplicated: Secondary | ICD-10-CM | POA: Insufficient documentation

## 2020-04-10 DIAGNOSIS — U071 COVID-19: Secondary | ICD-10-CM

## 2020-04-10 LAB — RESP PANEL BY RT-PCR (FLU A&B, COVID) ARPGX2
Influenza A by PCR: NEGATIVE
Influenza B by PCR: NEGATIVE
SARS Coronavirus 2 by RT PCR: POSITIVE — AB

## 2020-04-10 NOTE — ED Notes (Signed)
Per provider, pt allowed to eat/drink

## 2020-04-10 NOTE — ED Provider Notes (Signed)
[facility] EMERGENCY DEPARTMENT Provider Note  CSN: [id number] Arrival date & time: 04/10/20 1434    History Chief Complaint  Patient presents with  . Shortness of Breath    HPI  Travis Lynch is a 44 y.o. male with history of asthma reports onset of general malaise, subjective fever, chest congestion and occasional cough about 5 days ago. He took some motrin, used his inhaler and took some OTC cough medicine with some improvement but still feels like he has thick sputum that he is having trouble getting up. No CP, no N/V/D, no loss of taste or smell. He did a home Covid Ag test which was neg.    Past Medical History:  Diagnosis Date  . Asthma     Past Surgical History:  Procedure Laterality Date  . KNEE SURGERY      No family history on file.  Social History   Tobacco Use  . Smoking status: Never Smoker  . Smokeless tobacco: Never Used  Substance Use Topics  . Alcohol use: No  . Drug use: No     Home Medications Prior to Admission medications   Medication Sig Start Date End Date Taking? Authorizing Provider  albuterol (PROVENTIL HFA;VENTOLIN HFA) 108 (90 BASE) MCG/ACT inhaler Inhale 2 puffs into the lungs every 6 (six) hours as needed. WHEEZING AND SHORTNESS OF BREATH    [provider]  Fluticasone-Salmeterol (ADVAIR) 250-50 MCG/DOSE AEPB Inhale 1 puff into the lungs every 12 (twelve) hours.    [provider]     Allergies    Apple and Pear   Review of Systems   Review of Systems A comprehensive review of systems was completed and negative except as noted in HPI.    Physical Exam BP 134/81   Pulse 95   Temp 98.6 F (37 C) (Oral)   Resp (!) 22   SpO2 99%   Physical Exam Vitals and nursing note reviewed.  Constitutional:      Appearance: Normal appearance.  HENT:     Head: Normocephalic and atraumatic.     Nose: Nose normal.     Mouth/Throat:     Mouth: Mucous membranes are moist.  Eyes:     Extraocular Movements:  Extraocular movements intact.     Conjunctiva/sclera: Conjunctivae normal.  Cardiovascular:     Rate and Rhythm: Normal rate.  Pulmonary:     Effort: Pulmonary effort is normal.     Breath sounds: Examination of the right-lower field reveals rales. Rales present.  Abdominal:     General: Abdomen is flat.     Palpations: Abdomen is soft.     Tenderness: There is no abdominal tenderness.  Musculoskeletal:        General: No swelling. Normal range of motion.     Cervical back: Neck supple.  Skin:    General: Skin is warm and dry.  Neurological:     General: No focal deficit present.     Mental Status: He is alert.  Psychiatric:        Mood and Affect: Mood normal.      ED Results / Procedures / Treatments   Labs (all labs ordered are listed, but only abnormal results are displayed) Labs Reviewed  RESP PANEL BY RT-PCR (FLU A&B, COVID) [id number] - Abnormal; Notable for the following components:      Result Value   SARS Coronavirus 2 by RT PCR POSITIVE (*)    All other components within normal limits    EKG  EKG Interpretation  Date/Time:  Sunday April 10 2020 14:41:36 EST Ventricular Rate:  108 PR Interval:  130 QRS Duration: 88 QT Interval:  304 QTC Calculation: 407 R Axis:   -118 Text Interpretation: Sinus tachycardia Right superior axis deviation Abnormal ECG Since last tracing Rate faster Confirmed by Susy Frizzle [phone]) on 04/10/2020 3:38:03 PM   Radiology DG Chest Portable 1 View  Result Date: 04/10/2020 CLINICAL DATA:  Shortness of breath. EXAM: PORTABLE CHEST 1 VIEW COMPARISON:  December 15, 2014 FINDINGS: Very mild atelectasis and/or early infiltrate is seen within the bilateral lung bases. There is no evidence of a pleural effusion or pneumothorax. The heart size and mediastinal contours are within normal limits. The visualized skeletal structures are unremarkable. IMPRESSION: Very mild bibasilar atelectasis and/or early infiltrate. Electronically Signed    By: Aram Candela M.D.   On: 04/10/2020 15:15    Procedures Procedures  Medications Ordered in the ED Medications - No data to display   MDM Rules/Calculators/A&P MDM CXR images and results reviewed, possible early infiltrate in R base. Will check Covid PCR and if neg, anticipate discharge with oral Abx.  ED Course  I have reviewed the triage vital signs and the nursing notes.  Pertinent labs & imaging results that were available during my care of the patient were reviewed by me and considered in my medical decision making (see chart for details).  Clinical Course as of Apr 10 2045  Wynelle Link Apr 10, 2020  3557 Patient's Covid is positive. He was given standard quarantine and return instructions. He has been vaccinated.    [CS]  1918 Travis Lynch was evaluated in Emergency Department on 04/10/2020 for the symptoms described in the history of present illness. He was evaluated in the context of the global COVID-19 pandemic, which necessitated consideration that the patient might be at risk for infection with the SARS-CoV-2 virus that causes COVID-19. Institutional protocols and algorithms that pertain to the evaluation of patients at risk for COVID-19 are in a state of rapid change based on information released by regulatory bodies including the CDC and federal and state organizations. These policies and algorithms were followed during the patient's care in the ED.     [CS]    Clinical Course User Index [CS] Pollyann Savoy, MD    Final Clinical Impression(s) / ED Diagnoses Final diagnoses:  COVID-19    Rx / DC Orders ED Discharge Orders    None       Pollyann Savoy, MD 04/10/20 2046

## 2020-04-10 NOTE — ED Triage Notes (Signed)
Pt reports SOB since yesterday.  Reports non-productive cough and body aches on Wednesday that resolved.  Negative home COVID test.

## 2020-04-10 NOTE — ED Notes (Signed)
Patient verbalizes understanding of discharge instructions. Opportunity for questioning and answers were provided. Armband removed by staff, pt discharged from ED via wheelchair.  

## 2020-04-11 ENCOUNTER — Ambulatory Visit (HOSPITAL_COMMUNITY)
Admission: RE | Admit: 2020-04-11 | Discharge: 2020-04-11 | Disposition: A | Discharge status: Home / Self Care | Payer: PRIVATE HEALTH INSURANCE | Source: Ambulatory Visit | Attending: Pulmonary Disease | Admitting: Pulmonary Disease

## 2020-04-11 ENCOUNTER — Telehealth (HOSPITAL_COMMUNITY): Payer: Self-pay | Admitting: Emergency Medicine

## 2020-04-11 ENCOUNTER — Telehealth (HOSPITAL_COMMUNITY): Payer: Self-pay | Admitting: Family

## 2020-04-11 ENCOUNTER — Encounter: Payer: Self-pay | Admitting: Physician Assistant

## 2020-04-11 ENCOUNTER — Other Ambulatory Visit: Payer: Self-pay | Admitting: Physician Assistant

## 2020-04-11 DIAGNOSIS — U071 COVID-19: Secondary | ICD-10-CM

## 2020-04-11 DIAGNOSIS — J45909 Unspecified asthma, uncomplicated: Secondary | ICD-10-CM | POA: Diagnosis not present

## 2020-04-11 MED ORDER — METHYLPREDNISOLONE SODIUM SUCC 125 MG IJ SOLR: 125.0000 mg | Freq: Once | INTRAMUSCULAR | Status: DC | PRN

## 2020-04-11 MED ORDER — SODIUM CHLORIDE 0.9 % IV SOLN: INTRAVENOUS | Status: DC | PRN

## 2020-04-11 MED ORDER — EPINEPHRINE 0.3 MG/0.3ML IJ SOAJ: 0.3000 mg | Freq: Once | INTRAMUSCULAR | Status: DC | PRN

## 2020-04-11 MED ORDER — SOTROVIMAB 500 MG/8ML IV SOLN
500.0000 mg | Freq: Once | INTRAVENOUS | Status: AC
  Administered 2020-04-11: 500 mg via INTRAVENOUS

## 2020-04-11 MED ORDER — DIPHENHYDRAMINE HCL 50 MG/ML IJ SOLN: 50.0000 mg | Freq: Once | INTRAMUSCULAR | Status: DC | PRN

## 2020-04-11 MED ORDER — ALBUTEROL SULFATE HFA 108 (90 BASE) MCG/ACT IN AERS: 2.0000 | INHALATION_SPRAY | Freq: Once | RESPIRATORY_TRACT | Status: DC | PRN

## 2020-04-11 MED ORDER — FAMOTIDINE IN NACL 20-0.9 MG/50ML-% IV SOLN: 20.0000 mg | Freq: Once | INTRAVENOUS | Status: DC | PRN

## 2020-04-11 NOTE — Progress Notes (Signed)
I connected by phone with Travis Lynch on 04/11/2020 at 10:54 AM to discuss the potential use of a new treatment for mild to moderate COVID-19 viral infection in non-hospitalized patients.  This patient is a 44 y.o. male that meets the FDA criteria for Emergency Use Authorization of COVID monoclonal antibody sotrovimab, casirivimab/imdevimab or bamlamivimab/estevimab.  Has a (+) direct SARS-CoV-2 viral test result  Has mild or moderate COVID-19   Is NOT hospitalized due to COVID-19  Is within 10 days of symptom onset  Has at least one of the high risk factor(s) for progression to severe COVID-19 and/or hospitalization as defined in EUA.  Specific high risk criteria : BMI > 25 and Chronic Lung Disease   I have spoken and communicated the following to the patient or parent/caregiver regarding COVID monoclonal antibody treatment:  1. FDA has authorized the emergency use for the treatment of mild to moderate COVID-19 in adults and pediatric patients with positive results of direct SARS-CoV-2 viral testing who are 42 years of age and older weighing at least 40 kg, and who are at high risk for progressing to severe COVID-19 and/or hospitalization.  2. The significant known and potential risks and benefits of COVID monoclonal antibody, and the extent to which such potential risks and benefits are unknown.  3. Information on available alternative treatments and the risks and benefits of those alternatives, including clinical trials.  4. Patients treated with COVID monoclonal antibody should continue to self-isolate and use infection control measures (e.g., wear mask, isolate, social distance, avoid sharing personal items, clean and disinfect "high touch" surfaces, and frequent handwashing) according to CDC guidelines.   5. The patient or parent/caregiver has the option to accept or refuse COVID monoclonal antibody treatment.  After reviewing this information with the patient, the patient has  agreed to receive one of the available covid 19 monoclonal antibodies and will be provided an appropriate fact sheet prior to infusion.  Sx onset 11/24. Set up for infusion on 11/30 @ 4:30pm. Directions given to [facility]. Pt is aware that insurance will be charged an infusion fee. Pt is fully vaccinated. + in epic.   [name] 04/11/2020 10:54 AM

## 2020-04-11 NOTE — Discharge Instructions (Signed)
10 Things You Can Do to Manage Your COVID-19 Symptoms at Home If you have possible or confirmed COVID-19: 1. Stay home from work and school. And stay away from other public places. If you must go out, avoid using any kind of public transportation, ridesharing, or taxis. 2. Monitor your symptoms carefully. If your symptoms get worse, call your healthcare provider immediately. 3. Get rest and stay hydrated. 4. If you have a medical appointment, call the healthcare provider ahead of time and tell them that you have or may have COVID-19. 5. For medical emergencies, call 911 and notify the dispatch personnel that you have or may have COVID-19. 6. Cover your cough and sneezes with a tissue or use the inside of your elbow. 7. Wash your hands often with soap and water for at least 20 seconds or clean your hands with an alcohol-based hand sanitizer that contains at least 60% alcohol. 8. As much as possible, stay in a specific room and away from other people in your home. Also, you should use a separate bathroom, if available. If you need to be around other people in or outside of the home, wear a mask. 9. Avoid sharing personal items with other people in your household, like dishes, towels, and bedding. 10. Clean all surfaces that are touched often, like counters, tabletops, and doorknobs. Use household cleaning sprays or wipes according to the label instructions. cdc.gov/coronavirus 11/12/2018 This information is not intended to replace advice given to you by your health care provider. Make sure you discuss any questions you have with your health care provider. Document Revised: 04/16/2019 Document Reviewed: 04/16/2019 Elsevier Patient Education  2020 Elsevier Inc.  What types of side effects do monoclonal antibody drugs cause?  Common side effects  In general, the more common side effects caused by monoclonal antibody drugs include: . Allergic reactions, such as hives or itching . Flu-like signs and  symptoms, including chills, fatigue, fever, and muscle aches and pains . Nausea, vomiting . Diarrhea . Skin rashes . Low blood pressure   The CDC is recommending patients who receive monoclonal antibody treatments wait at least 90 days before being vaccinated.  Currently, there are no data on the safety and efficacy of mRNA COVID-19 vaccines in persons who received monoclonal antibodies or convalescent plasma as part of COVID-19 treatment. Based on the estimated half-life of such therapies as well as evidence suggesting that reinfection is uncommon in the 90 days after initial infection, vaccination should be deferred for at least 90 days, as a precautionary measure until additional information becomes available, to avoid interference of the antibody treatment with vaccine-induced immune responses.  If you have any questions or concerns after the infusion please call the Advanced Practice Provider on call at [phone]. This number is only intended for your use regarding questions or concerns about the infusion post-treatment side-effects.  Please do not provide this number to others for use.   If someone you know is interested in receiving treatment please have them call the COVID hotline at [phone].   

## 2020-04-11 NOTE — Progress Notes (Signed)
°  Diagnosis: COVID-19 ° °Physician:dr wright ° °Procedure: Diagnosis: COVID-19 ° °Physician: Dr. Patrick Wright ° °Procedure: Covid Infusion Clinic Med: Sotrovimab infusion - Provided patient with sotrovimab fact sheet for patients, parents, and caregivers prior to infusion.  ° °Complications: No immediate complications noted ° °Discharge: Discharged home ° ° ° °Complications: No immediate complications noted. ° °Discharge: Discharged home  ° °Amy Gothard S Kelcee Bjorn °04/11/2020 ° °

## 2020-04-11 NOTE — Telephone Encounter (Signed)
Called to discuss with Charyl Dancer about Covid symptoms and potential candidacy for the use of sotrovimab, a combination monoclonal antibody infusion for those with mild to moderate Covid symptoms and at a high risk of hospitalization.     Pt is qualified for this infusion at the infusion center due to co-morbid conditions and/or a member of an at-risk group, however unable to reach patient. Attempted 4 times over a few minutes to call as RN has just screened. Each time, there was 1 ring and call was sent to VM that was full and could not accept messages.   Kolette Vey,NP

## 2020-04-11 NOTE — Telephone Encounter (Signed)
Called pt and explained possible monoclonal antibody treatment. Sx started 11/24. Tested positive 11/28 at [facility] ED. Sx include SOB, fatigue, chills, and cough. Qualifying risk factors moderate to severe asthma, DM2, and HTN (diet controlled). Pt interested in tx. Informed pt an APP will call back to possibly schedule an appointment.

## 2020-04-11 NOTE — Progress Notes (Signed)
Patient reviewed Fact Sheet for Patients, Parents, and Caregivers for Emergency Use Authorization (EUA) of Sotrovimab for the Treatment of Coronavirus. Patient also reviewed and is agreeable to the estimated cost of treatment. Patient is agreeable to proceed.   

## 2022-08-08 IMAGING — DX DG CHEST 1V PORT
1 series · 1 of 1 positions shown · non-contrast
Comparison: December 15, 2014

CLINICAL DATA: Shortness of breath.

EXAM:
PORTABLE CHEST 1 VIEW

[w chest pa]
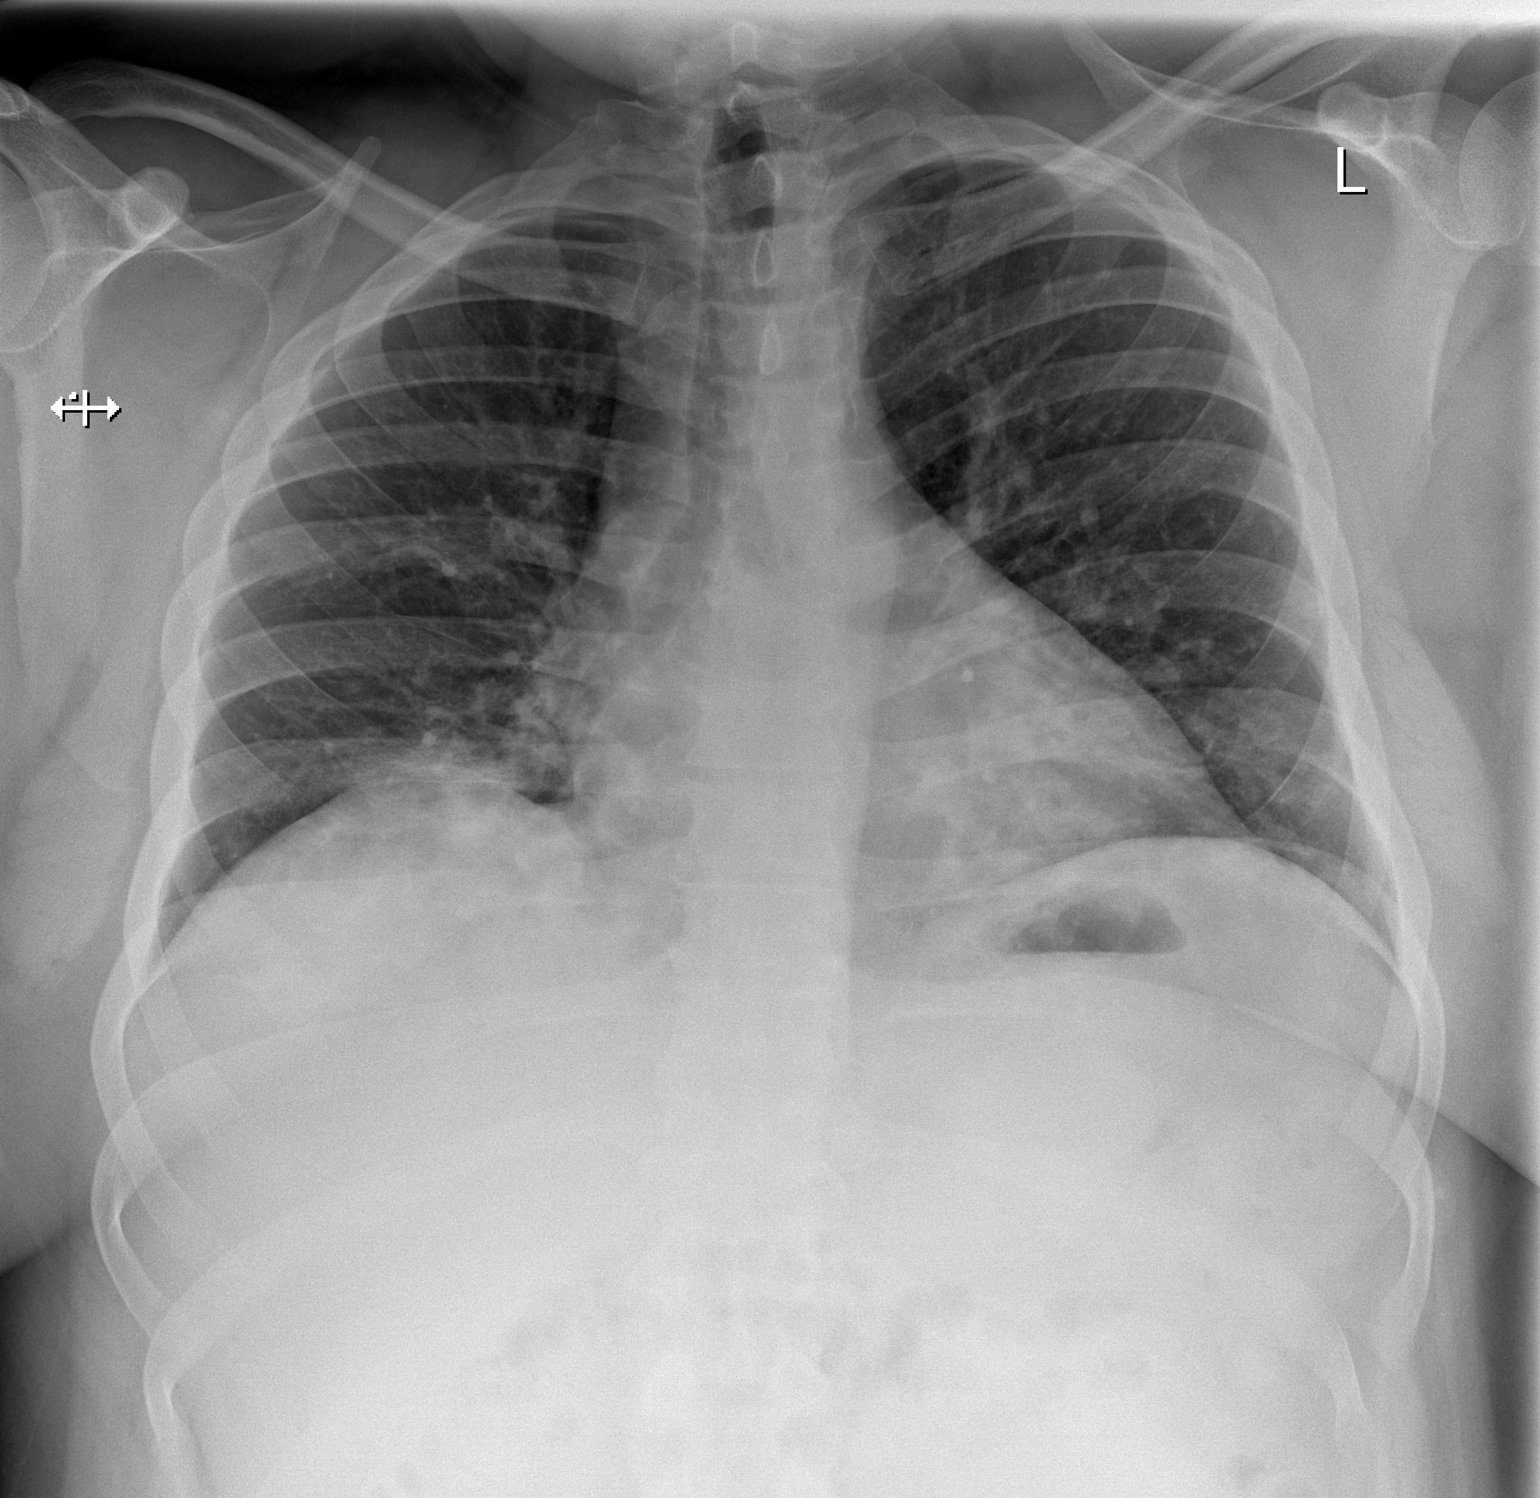

[1 of 1 positions shown; findings below may reference images not displayed]

FINDINGS: Very mild atelectasis and/or early infiltrate is seen within the
bilateral lung bases. There is no evidence of a pleural effusion or
pneumothorax. The heart size and mediastinal contours are within
normal limits. The visualized skeletal structures are unremarkable.
IMPRESSION: Very mild bibasilar atelectasis and/or early infiltrate.

## 2023-06-17 DIAGNOSIS — E291 Testicular hypofunction: Secondary | ICD-10-CM | POA: Diagnosis not present

## 2023-06-17 DIAGNOSIS — M5412 Radiculopathy, cervical region: Secondary | ICD-10-CM | POA: Diagnosis not present

## 2023-06-17 DIAGNOSIS — E66812 Obesity, class 2: Secondary | ICD-10-CM | POA: Diagnosis not present

## 2023-06-17 DIAGNOSIS — D696 Thrombocytopenia, unspecified: Secondary | ICD-10-CM | POA: Diagnosis not present

## 2023-06-17 DIAGNOSIS — I10 Essential (primary) hypertension: Secondary | ICD-10-CM | POA: Diagnosis not present

## 2023-06-17 DIAGNOSIS — E782 Mixed hyperlipidemia: Secondary | ICD-10-CM | POA: Diagnosis not present

## 2023-06-17 DIAGNOSIS — Z6839 Body mass index (BMI) 39.0-39.9, adult: Secondary | ICD-10-CM | POA: Diagnosis not present

## 2023-06-17 DIAGNOSIS — E1165 Type 2 diabetes mellitus with hyperglycemia: Secondary | ICD-10-CM | POA: Diagnosis not present

## 2023-06-19 DIAGNOSIS — M5412 Radiculopathy, cervical region: Secondary | ICD-10-CM | POA: Diagnosis not present

## 2023-08-13 DIAGNOSIS — G5601 Carpal tunnel syndrome, right upper limb: Secondary | ICD-10-CM | POA: Diagnosis not present

## 2023-09-01 ENCOUNTER — Emergency Department (HOSPITAL_BASED_OUTPATIENT_CLINIC_OR_DEPARTMENT_OTHER)
Admission: EM | Admit: 2023-09-01 | Discharge: 2023-09-01 | Disposition: A | Discharge status: Home / Self Care | Attending: Emergency Medicine | Admitting: Emergency Medicine

## 2023-09-01 ENCOUNTER — Encounter (HOSPITAL_BASED_OUTPATIENT_CLINIC_OR_DEPARTMENT_OTHER): Payer: Self-pay | Admitting: Emergency Medicine

## 2023-09-01 DIAGNOSIS — K921 Melena: Secondary | ICD-10-CM | POA: Diagnosis not present

## 2023-09-01 DIAGNOSIS — K625 Hemorrhage of anus and rectum: Secondary | ICD-10-CM | POA: Diagnosis present

## 2023-09-01 LAB — OCCULT BLOOD X 1 CARD TO LAB, STOOL: Fecal Occult Bld: NEGATIVE

## 2023-09-01 NOTE — ED Provider Notes (Signed)
 [place] EMERGENCY DEPARTMENT AT [facility] Provider Note   CSN: [id number] Arrival date & time: 09/01/23  1105     History  Chief Complaint  Patient presents with   Rectal Bleeding    Travis Lynch is a 48 y.o. male.  Patient with history of obesity presents today with complaints of rectal bleeding. He states that he had a bowel movement this morning and saw bright red blood in the toilet and it was present when he wiped as well. He denies any abdominal pain. States he has had 2 subsequent bowel movements after this that were not bloody. This has never happened before. He has never had a colonoscopy. Denies any concern for rectal foreign body. No anal itching. No painful bowel movements.  The history is provided by the patient. No language interpreter was used.  Rectal Bleeding      Home Medications Prior to Admission medications   Medication Sig Start Date End Date Taking? Authorizing Provider  albuterol  (PROVENTIL  HFA;VENTOLIN  HFA) 108 (90 BASE) MCG/ACT inhaler Inhale 2 puffs into the lungs every 6 (six) hours as needed. WHEEZING AND SHORTNESS OF BREATH    [provider]  Fluticasone-Salmeterol (ADVAIR) 250-50 MCG/DOSE AEPB Inhale 1 puff into the lungs every 12 (twelve) hours.    [provider]      Allergies    Apple juice and Pear    Review of Systems   Review of Systems  Gastrointestinal:  Positive for blood in stool and hematochezia.  All other systems reviewed and are negative.   Physical Exam Updated Vital Signs BP 136/85 (BP Location: Left Arm)   Pulse 94   Temp 98.7 F (37.1 C) (Oral)   Resp 18   SpO2 99%  Physical Exam Vitals and nursing note reviewed. Exam conducted with a chaperone present.  Constitutional:      General: He is not in acute distress.    Appearance: Normal appearance. He is normal weight. He is not ill-appearing, toxic-appearing or diaphoretic.  HENT:     Head: Normocephalic and atraumatic.   Cardiovascular:     Rate and Rhythm: Normal rate.  Pulmonary:     Effort: Pulmonary effort is normal. No respiratory distress.  Abdominal:     General: Abdomen is flat.     Palpations: Abdomen is soft.     Tenderness: There is no abdominal tenderness.  Genitourinary:    Comments: No hemorrhoids or fissures visualized. No blood or stool seen on DRE.  Musculoskeletal:        General: Normal range of motion.     Cervical back: Normal range of motion.  Skin:    General: Skin is warm and dry.  Neurological:     General: No focal deficit present.     Mental Status: He is alert.  Psychiatric:        Mood and Affect: Mood normal.        Behavior: Behavior normal.     ED Results / Procedures / Treatments   Labs (all labs ordered are listed, but only abnormal results are displayed) Labs Reviewed  OCCULT BLOOD X 1 CARD TO LAB, STOOL    EKG None  Radiology No results found.  Procedures Procedures    Medications Ordered in ED Medications - No data to display  ED Course/ Medical Decision Making/ A&P  Medical Decision Making Amount and/or Complexity of Data Reviewed Labs: ordered.   Patient presents today with complaints of 1 episode of rectal bleeding this morning.  He is afebrile, nontoxic-appearing, and in no acute distress reassuring vital signs.  Physical exam reveals abdomen soft and nontender.  Rectal exam reveals no signs of hemorrhoids or fissures.  No signs of bleeding on DRE.  He has had 2 subsequent bowel movements that were nonbloody.  His Hemoccult is negative.  Given this, doubt acute GI hemorrhage.  He is not anticoagulated.  Doubt acute intra-abdominal etiology requiring labs or imaging at this time.  Discussed same with patient is understanding and agreement with this.  He should have a screening colonoscopy given his age, therefore will give referral to GI for follow-up. Evaluation and diagnostic testing in the emergency  department does not suggest an emergent condition requiring admission or immediate intervention beyond what has been performed at this time.  Plan for discharge with close PCP follow-up.  Patient is understanding and amenable with plan, educated on red flag symptoms that would prompt immediate return.  Patient discharged in stable condition.  Final Clinical Impression(s) / ED Diagnoses Final diagnoses:  Blood in stool    Rx / DC Orders ED Discharge Orders          Ordered    Ambulatory referral to Gastroenterology        09/01/23 1258          An After Visit Summary was printed and given to the patient.     Sherra Dk, PA-C 09/01/23 1310    Owen Blowers P, DO 09/01/23 1429

## 2023-09-01 NOTE — ED Triage Notes (Signed)
 Reports dark blood in stools x 1 this morning. Denies any other pain or symptoms. States had "uncomfortable feeling in rectum" last month but has since resolved.

## 2023-09-01 NOTE — Discharge Instructions (Signed)
 Your workup in the ER today was reassuring for acute findings.  The stool test did not show any signs of blood.  Given you have had 2 regular bowel movements afterwards that have not been bloody, no further evaluation is indicated.  I have given you a referral to gastroenterology to follow-up with.  You should schedule a routine screening colonoscopy with them regardless of the symptoms given that you are of age to start having these done.  Please follow-up with your primary provider in the office as well.  Return if development of any new or worsening symptoms.

## 2023-09-09 DIAGNOSIS — K644 Residual hemorrhoidal skin tags: Secondary | ICD-10-CM | POA: Diagnosis not present

## 2023-09-09 DIAGNOSIS — N529 Male erectile dysfunction, unspecified: Secondary | ICD-10-CM | POA: Diagnosis not present

## 2023-10-14 DIAGNOSIS — Z23 Encounter for immunization: Secondary | ICD-10-CM | POA: Diagnosis not present

## 2023-10-14 DIAGNOSIS — E1165 Type 2 diabetes mellitus with hyperglycemia: Secondary | ICD-10-CM | POA: Diagnosis not present

## 2023-10-14 DIAGNOSIS — Z0000 Encounter for general adult medical examination without abnormal findings: Secondary | ICD-10-CM | POA: Diagnosis not present

## 2023-10-14 DIAGNOSIS — R7989 Other specified abnormal findings of blood chemistry: Secondary | ICD-10-CM | POA: Diagnosis not present

## 2023-10-14 DIAGNOSIS — E782 Mixed hyperlipidemia: Secondary | ICD-10-CM | POA: Diagnosis not present

## 2023-10-14 DIAGNOSIS — I10 Essential (primary) hypertension: Secondary | ICD-10-CM | POA: Diagnosis not present

## 2023-11-11 DIAGNOSIS — Z7984 Long term (current) use of oral hypoglycemic drugs: Secondary | ICD-10-CM | POA: Diagnosis not present

## 2023-11-11 DIAGNOSIS — Z7985 Long-term (current) use of injectable non-insulin antidiabetic drugs: Secondary | ICD-10-CM | POA: Diagnosis not present

## 2023-11-11 DIAGNOSIS — E1165 Type 2 diabetes mellitus with hyperglycemia: Secondary | ICD-10-CM | POA: Diagnosis not present

## 2023-12-23 DIAGNOSIS — Z7984 Long term (current) use of oral hypoglycemic drugs: Secondary | ICD-10-CM | POA: Diagnosis not present

## 2023-12-23 DIAGNOSIS — Z7985 Long-term (current) use of injectable non-insulin antidiabetic drugs: Secondary | ICD-10-CM | POA: Diagnosis not present

## 2023-12-23 DIAGNOSIS — E1165 Type 2 diabetes mellitus with hyperglycemia: Secondary | ICD-10-CM | POA: Diagnosis not present

## 2024-01-06 DIAGNOSIS — Z23 Encounter for immunization: Secondary | ICD-10-CM | POA: Diagnosis not present

## 2024-01-06 DIAGNOSIS — N529 Male erectile dysfunction, unspecified: Secondary | ICD-10-CM | POA: Diagnosis not present

## 2024-01-06 DIAGNOSIS — N5312 Painful ejaculation: Secondary | ICD-10-CM | POA: Diagnosis not present

## 2024-01-06 DIAGNOSIS — R7989 Other specified abnormal findings of blood chemistry: Secondary | ICD-10-CM | POA: Diagnosis not present

## 2024-01-06 DIAGNOSIS — E1165 Type 2 diabetes mellitus with hyperglycemia: Secondary | ICD-10-CM | POA: Diagnosis not present

## 2024-01-21 DIAGNOSIS — E1165 Type 2 diabetes mellitus with hyperglycemia: Secondary | ICD-10-CM | POA: Diagnosis not present

## 2024-03-09 DIAGNOSIS — R7989 Other specified abnormal findings of blood chemistry: Secondary | ICD-10-CM | POA: Diagnosis not present
